# Patient Record
Sex: Male | Born: 1960 | Race: White | Hispanic: No | Smoking: Never smoker
Health system: Southern US, Community
[De-identification: ages and names within clinical notes are randomized; demographics above are authoritative.]

## PROBLEM LIST (undated history)

## (undated) DIAGNOSIS — I10 Essential (primary) hypertension: Secondary | ICD-10-CM

## (undated) DIAGNOSIS — C801 Malignant (primary) neoplasm, unspecified: Secondary | ICD-10-CM

## (undated) HISTORY — PX: HERNIA REPAIR: SHX51

---

## 2016-06-11 ENCOUNTER — Other Ambulatory Visit (HOSPITAL_COMMUNITY): Payer: Self-pay

## 2016-06-11 ENCOUNTER — Other Ambulatory Visit: Payer: Self-pay

## 2016-06-11 ENCOUNTER — Encounter (HOSPITAL_COMMUNITY): Payer: Self-pay

## 2016-06-11 ENCOUNTER — Observation Stay (HOSPITAL_COMMUNITY)
Admission: EM | Admit: 2016-06-11 | Discharge: 2016-06-12 | Disposition: A | Discharge status: Home / Self Care | Payer: BLUE CROSS/BLUE SHIELD | Attending: Internal Medicine | Admitting: Internal Medicine

## 2016-06-11 ENCOUNTER — Emergency Department (HOSPITAL_COMMUNITY): Payer: BLUE CROSS/BLUE SHIELD

## 2016-06-11 DIAGNOSIS — R6884 Jaw pain: Secondary | ICD-10-CM | POA: Insufficient documentation

## 2016-06-11 DIAGNOSIS — M542 Cervicalgia: Secondary | ICD-10-CM | POA: Diagnosis not present

## 2016-06-11 DIAGNOSIS — I160 Hypertensive urgency: Secondary | ICD-10-CM | POA: Diagnosis present

## 2016-06-11 DIAGNOSIS — H9202 Otalgia, left ear: Principal | ICD-10-CM | POA: Insufficient documentation

## 2016-06-11 DIAGNOSIS — I159 Secondary hypertension, unspecified: Secondary | ICD-10-CM

## 2016-06-11 DIAGNOSIS — R918 Other nonspecific abnormal finding of lung field: Secondary | ICD-10-CM | POA: Diagnosis not present

## 2016-06-11 DIAGNOSIS — K029 Dental caries, unspecified: Secondary | ICD-10-CM | POA: Diagnosis present

## 2016-06-11 DIAGNOSIS — R911 Solitary pulmonary nodule: Secondary | ICD-10-CM

## 2016-06-11 DIAGNOSIS — I10 Essential (primary) hypertension: Secondary | ICD-10-CM | POA: Diagnosis not present

## 2016-06-11 LAB — BASIC METABOLIC PANEL
Anion gap: 10 (ref 5–15)
CO2: 23 mmol/L (ref 22–32)
CREATININE: 0.94 mg/dL (ref 0.61–1.24)
Calcium: 8.9 mg/dL (ref 8.9–10.3)
Chloride: 102 mmol/L (ref 101–111)
Glucose, Bld: 107 mg/dL — ABNORMAL HIGH (ref 65–99)
POTASSIUM: 3.1 mmol/L — AB (ref 3.5–5.1)
SODIUM: 135 mmol/L (ref 135–145)

## 2016-06-11 LAB — RAPID URINE DRUG SCREEN, HOSP PERFORMED
Amphetamines: NOT DETECTED
Barbiturates: NOT DETECTED
Benzodiazepines: NOT DETECTED
Cocaine: NOT DETECTED
OPIATES: POSITIVE — AB
Tetrahydrocannabinol: NOT DETECTED

## 2016-06-11 LAB — CBC
HEMATOCRIT: 40.9 % (ref 39.0–52.0)
Hemoglobin: 14.2 g/dL (ref 13.0–17.0)
MCH: 31.7 pg (ref 26.0–34.0)
MCHC: 34.7 g/dL (ref 30.0–36.0)
MCV: 91.3 fL (ref 78.0–100.0)
PLATELETS: 277 10*3/uL (ref 150–400)
RBC: 4.48 MIL/uL (ref 4.22–5.81)
RDW: 12.6 % (ref 11.5–15.5)
WBC: 8.7 10*3/uL (ref 4.0–10.5)

## 2016-06-11 LAB — URINALYSIS, ROUTINE W REFLEX MICROSCOPIC
Bilirubin Urine: NEGATIVE
Glucose, UA: NEGATIVE mg/dL
HGB URINE DIPSTICK: NEGATIVE
Ketones, ur: NEGATIVE mg/dL
LEUKOCYTES UA: NEGATIVE
NITRITE: NEGATIVE
Protein, ur: NEGATIVE mg/dL
SPECIFIC GRAVITY, URINE: 1.007 (ref 1.005–1.030)
pH: 6 (ref 5.0–8.0)

## 2016-06-11 LAB — I-STAT TROPONIN, ED: Troponin i, poc: 0 ng/mL (ref 0.00–0.08)

## 2016-06-11 MED ORDER — ENOXAPARIN SODIUM 40 MG/0.4ML ~~LOC~~ SOLN: 40.0000 mg | SUBCUTANEOUS | Status: DC

## 2016-06-11 MED ORDER — HYDROCHLOROTHIAZIDE 25 MG PO TABS
25.0000 mg | ORAL_TABLET | Freq: Every day | ORAL | Status: DC
  Administered 2016-06-12 (×2): 25 mg via ORAL
  Filled 2016-06-11 (×2): qty 1

## 2016-06-11 MED ORDER — SODIUM CHLORIDE 0.9% FLUSH: 3.0000 mL | Freq: Two times a day (BID) | INTRAVENOUS | Status: DC

## 2016-06-11 MED ORDER — ASPIRIN 81 MG PO CHEW
324.0000 mg | CHEWABLE_TABLET | Freq: Once | ORAL | Status: AC
  Administered 2016-06-11: 324 mg via ORAL
  Filled 2016-06-11: qty 4

## 2016-06-11 MED ORDER — NAPROXEN 250 MG PO TABS
500.0000 mg | ORAL_TABLET | Freq: Three times a day (TID) | ORAL | Status: DC | PRN
  Administered 2016-06-12: 500 mg via ORAL
  Filled 2016-06-11: qty 2

## 2016-06-11 MED ORDER — LABETALOL HCL 5 MG/ML IV SOLN
10.0000 mg | Freq: Once | INTRAVENOUS | Status: AC
  Administered 2016-06-11: 10 mg via INTRAVENOUS
  Filled 2016-06-11: qty 4

## 2016-06-11 NOTE — ED Notes (Signed)
PER EMS: pt from [facility], sent here for intermittent jaw pain that radiates from left side of jaw to the right side of his jaw and ears. UCC called EMS because of the jaw pain associated with diaphoresis. BP- 219/113, HR-94, 2L [place] 96%. CBG-102. RR-18. A&Ox4. Denies CP/SOB.

## 2016-06-11 NOTE — H&P (Signed)
Date: 06/11/2016               Patient Name:  George Blake MRN: [id number]  DOB: 1961/03/22 Age / Sex: 55 y.o., male   PCP: Christain Sacramento, MD         Medical Service: Internal Medicine Teaching Service         Attending Physician: Dr. Aldine Contes, MD    First Contact: Dr. Zada Finders Pager: [phone]  Second Contact: Dr. Charlott Rakes Pager: [phone]       After Hours (After 5p/  First Contact Pager: [phone]  weekends / holidays): Second Contact Pager: [phone]   Chief Complaint: "My ear hurts."  History of Present Illness:  George Blake is a 55 year old man with no known past medical history here with left-sided otalgia.  Last Wednesday, he began having intermittent left-sided ear pain that was worse with lying on his ear. He also is having pain on his left lower molars. He says this pain feels similar to when he cracked a tooth years ago. He went to an urgent care today and his blood pressure was 220/110 so he was sent to the emergency room. He denies any chest pain, diaphoresis, nausea, or exertional-component to his otalgia; no headache, vision changes, or hand-tingling; no jaw claudication, temporal artery tenderness, or vision changes; no fevers or chills; no paresthesias or trigeminal-area shooting pains; no jaw-grinding or tenderness or masseter tenderness; no hearing loss, vertigo, or tinnitus; review of systems was otherwise non-revealing.  Meds: No current facility-administered medications for this encounter.   Current Outpatient Prescriptions  Medication Sig Dispense Refill  . ibuprofen (ADVIL,MOTRIN) 200 MG tablet Take 200 mg by mouth every 4 (four) hours as needed for moderate pain.      Allergies: Allergies as of 06/11/2016  . (Not on File)   History reviewed. No pertinent past medical history. Past Surgical History  Procedure Laterality Date  . Hernia repair     Family history: Father had an MI at 74 years old Sister has hypertension No history of  cancer  Social History   Social History  . Marital Status: Widowed    Spouse Name: N/A  . Number of Children: N/A  . Years of Education: N/A   Occupational History  . Not on file.   Social History Main Topics  . Smoking status: Never Smoker   . Smokeless tobacco: Current User    Types: Chew  . Alcohol Use: Yes  . Drug Use: Not on file  . Sexual Activity: Not on file   Other Topics Concern  . Not on file   Social History Narrative  . No narrative on file   Review of Systems: Per HPI  Physical Exam: Blood pressure 150/101, pulse 81, temperature 98.5 F (36.9 C), temperature source Oral, resp. rate 19, SpO2 93 %. General: resting in bed comfortably, appropriately conversational HEENT: no scleral icterus, extra-ocular muscles intact, very poor dentition with numerous missing teeth, without an overt abscess, small vesicle on left tympanic membrane, small effusion on right tympanic membrane, no external ear tenderness, no temporal tenderness to palpation Cardiac: regular rate and rhythm, no rubs, murmurs or gallops Pulm: breathing well, clear to auscultation bilaterally Abd: bowel sounds normal, soft, nondistended, non-tender Ext: warm and well perfused, without pedal edema Lymph: no cervical or supraclavicular lymphadenopathy Skin: no rash, hair, or nail changes Neuro: alert and oriented X3, cranial nerves II-XII grossly intact, moving all extremities well  Lab results: Basic Metabolic Panel:  Recent Labs  06/11/16 1857  NA 135  K 3.1*  CL 102  CO2 23  GLUCOSE 107*  BUN <5*  CREATININE 0.94  CALCIUM 8.9   CBC:  Recent Labs  06/11/16 1857  WBC 8.7  HGB 14.2  HCT 40.9  MCV 91.3  PLT 277   Urine Drug Screen: Drugs of Abuse     Component Value Date/Time   LABOPIA POSITIVE* 06/11/2016 2051   COCAINSCRNUR NONE DETECTED 06/11/2016 2051   LABBENZ NONE DETECTED 06/11/2016 2051   AMPHETMU NONE DETECTED 06/11/2016 2051   THCU NONE DETECTED 06/11/2016 2051     LABBARB NONE DETECTED 06/11/2016 2051    Urinalysis:  Recent Labs  06/11/16 2051  COLORURINE YELLOW  LABSPEC 1.007  PHURINE 6.0  GLUCOSEU NEGATIVE  HGBUR NEGATIVE  BILIRUBINUR NEGATIVE  KETONESUR NEGATIVE  PROTEINUR NEGATIVE  NITRITE NEGATIVE  LEUKOCYTESUR NEGATIVE   Imaging results:  Dg Chest 2 View  06/11/2016  CLINICAL DATA:  Left side tooth and jaw pain x 4 days, high BP today- no hx HTN, nonsmoker EXAM: CHEST  2 VIEW COMPARISON:  None. FINDINGS: The heart size and vascular pattern are normal. There is moderate diffuse interstitial prominence. Given the absence of cardiac enlargement, pleural effusion, or vascular congestion it is unlikely that this is due to pulmonary edema and is rather likely due to chronic interstitial lung disease. There is a possible 1 cm nodular opacity laterally in the right mid lung zone. IMPRESSION: Moderately severe interstitial change with no comparison study to to determine chronicity. Findings may be due to chronic lung disease. Possible nodular opacity on the right.  CT thorax suggested. Electronically Signed   By: Skipper Cliche M.D.   On: 06/11/2016 19:32    Other results: EKG: normal sinus rhythm, without ischemic changes  Assessment & Plan by Problem:  George Blake is a 55 year old man with no known past medical history here with left-sided otalgia that I think is mostly likely referred pain from poor dentition but may be hypertensive urgency.  Left-sided otalgia: Per above; I think this is likely referred pain from his poor dentition but this may be an anginal equilavent related to hypertensive urgency, although I think the latter is far less likely. He does have a small vesicle on his left tympanic membrane so Ramsay-Hunt is another consideration, but the intermittent pain and absence of skin lesions makes me this this is less likel as well. I doubt otitis media, giant cell arteritis, temporal neuralgia, or TMJ. We will check an  orthopentogram to rule out an abscess and treat his hypertension with oral medications. -Ordered orthopentogram -Started HCTZ 25mg  daily -Started naproxyn for pain -He will follow-up with PCP soon  Hypertension: Per above. -Started HCTZ 25mg  daily  Interstitial changes on chest x-ray with possible nodular opacity: He denies any shortness of breath nor history of smoking, and he is oxygenating well on room air. 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 I have a hard time seeing the nodular opacity; I think a repeat chest x-ray on an outpatient basis would be sufficient. -Recommend outpatient repeat chest x-ray  Dispo: Disposition is deferred at this time, awaiting improvement of current medical problems. Anticipated discharge in approximately 1 day(s).   The patient does have a current PCP Christain Sacramento, MD) and does need an [facility] hospital follow-up appointment after discharge.  The patient does have transportation limitations that hinder transportation to clinic appointments.  Signed: Loleta Chance, MD 06/11/2016, 9:59 PM

## 2016-06-11 NOTE — ED Provider Notes (Signed)
CSN: [id number]     Arrival date & time 06/11/16  1843 History   First MD Initiated Contact with Patient 06/11/16 1855     Chief Complaint  Patient presents with  . Jaw Pain     (Consider location/radiation/quality/duration/timing/severity/associated sxs/prior Treatment) HPI George Blake is a 55 y.o. male with no known medical problems, presents to emergency department complaining of jaw pain, ear pain or facial pain. Patient went to urgent care complaining of this pain. States he has had intermittent pain that changes from one ear to the next, report some spatial pain, some jaw pain, COMES and goes. Reports pain is not associated with exertion, not associated with eating. At time of my interview, patient denies any pain, states no pain with biting down. He does report back teeth. He denies any nasal congestion or sore throat. Denies any fever or chills. He states when he went to urgent care he was found to be diaphoretic. He states that just started at urgent care. He states that he is starting to begin to sweat here as well, but denies any pain at this time still. He was sent here from urgent care due to this jaw pain, diaphoresis, elevated blood pressure. Patient states he has not seen a doctor in over 5 years. He does not have a primary care provider. He does not take any medications other than ibuprofen for headache and toothaches. He is not a smoker. He has no complaints at this time.  History reviewed. No pertinent past medical history. Past Surgical History  Procedure Laterality Date  . Hernia repair     No family history on file. Social History  Substance Use Topics  . Smoking status: Never Smoker   . Smokeless tobacco: Current User    Types: Chew  . Alcohol Use: Yes    Review of Systems  Constitutional: Negative for fever and chills.  HENT: Positive for dental problem and ear pain. Negative for congestion, ear discharge, mouth sores, postnasal drip and sore throat.    Respiratory: Negative for cough, chest tightness and shortness of breath.   Cardiovascular: Negative for chest pain, palpitations and leg swelling.  Gastrointestinal: Negative for nausea, vomiting, abdominal pain, diarrhea and abdominal distention.  Genitourinary: Negative for dysuria, urgency, frequency and hematuria.  Musculoskeletal: Negative for myalgias, arthralgias, neck pain and neck stiffness.  Skin: Negative for rash.  Allergic/Immunologic: Negative for immunocompromised state.  Neurological: Positive for headaches. Negative for dizziness, weakness, light-headedness and numbness.  All other systems reviewed and are negative.     Allergies  Review of patient's allergies indicates not on file.  Home Medications   Prior to Admission medications   Not on File   BP 183/111 mmHg  Pulse 96  Temp(Src) 98.5 F (36.9 C) (Oral)  Resp 16  SpO2 96% Physical Exam  Constitutional: He is oriented to person, place, and time. He appears well-developed and well-nourished. No distress.  HENT:  Head: Normocephalic and atraumatic.  Mouth/Throat: Oropharynx is clear and moist.  Extensive dental decay. No obvious dental abscess, however there is diffuse gingivitis. No trismus. No swelling under the tongue.  Eyes: Conjunctivae are normal.  Neck: Normal range of motion. Neck supple.  Cardiovascular: Normal rate, regular rhythm and normal heart sounds.   Pulmonary/Chest: Effort normal. No respiratory distress. He has no wheezes. He has no rales.  Abdominal: Soft. Bowel sounds are normal. He exhibits no distension. There is no tenderness. There is no rebound.  Musculoskeletal: He exhibits no edema.  Neurological:  He is alert and oriented to person, place, and time.  Skin: Skin is warm and dry. He is not diaphoretic.  Nursing note and vitals reviewed.   ED Course  Procedures (including critical care time) Labs Review Labs Reviewed  BASIC METABOLIC PANEL - Abnormal; Notable for the  following:    Potassium 3.1 (*)    Glucose, Bld 107 (*)    BUN <5 (*)    All other components within normal limits  URINE RAPID DRUG SCREEN, HOSP PERFORMED - Abnormal; Notable for the following:    Opiates POSITIVE (*)    All other components within normal limits  CBC  URINALYSIS, ROUTINE W REFLEX MICROSCOPIC (NOT AT [facility])  Randolm Idol, ED    Imaging Review Dg Chest 2 View  06/11/2016  CLINICAL DATA:  Left side tooth and jaw pain x 4 days, high BP today- no hx HTN, nonsmoker EXAM: CHEST  2 VIEW COMPARISON:  None. FINDINGS: The heart size and vascular pattern are normal. There is moderate diffuse interstitial prominence. Given the absence of cardiac enlargement, pleural effusion, or vascular congestion it is unlikely that this is due to pulmonary edema and is rather likely due to chronic interstitial lung disease. There is a possible 1 cm nodular opacity laterally in the right mid lung zone. IMPRESSION: Moderately severe interstitial change with no comparison study to to determine chronicity. Findings may be due to chronic lung disease. Possible nodular opacity on the right.  CT thorax suggested. Electronically Signed   By: Skipper Cliche M.D.   On: 06/11/2016 19:32   I have personally reviewed and evaluated these images and lab results as part of my medical decision-making.  ED ECG REPORT   Date: 06/12/2016  Rate: 94  Rhythm: normal sinus rhythm  QRS Axis: normal  Intervals: PR prolonged  ST/T Wave abnormalities: normal  Conduction Disutrbances:none  Narrative Interpretation:   Old EKG Reviewed: none available  I have personally reviewed the EKG tracing and agree with the computerized printout as noted.   MDM   Final diagnoses:  Secondary hypertension, unspecified  Jaw pain  Dental decay    Pt in ED with bilateral intermittent neck pain, jaw pain, ear pain. Also independent of pain diaphoresis. Hypertensive, initial BP 183/111, 219/113 at urgent care. Will check labs,  CXR, aspirin ordered.   Pt's labs all unremarkable. Discussed with Dr. Johnney Killian, who has seen pt. His symptoms are worrisome for atypical angina. Will admit for ro. Labetalol ordered for BP.   Spoke with internal medicine. Will admit.   Filed Vitals:   06/11/16 2000 06/11/16 2030 06/11/16 2100 06/11/16 2145  BP: 170/98 174/99 178/100 150/101  Pulse: 91 88 87 81  Temp:      TempSrc:      Resp:  17 13 19   SpO2: 98% 95% 94% 93%      Jeannett Senior, PA-C 06/12/16 0057  Charlesetta Shanks, MD 06/12/16 2325

## 2016-06-12 ENCOUNTER — Observation Stay (HOSPITAL_COMMUNITY): Payer: BLUE CROSS/BLUE SHIELD

## 2016-06-12 DIAGNOSIS — R6884 Jaw pain: Secondary | ICD-10-CM

## 2016-06-12 DIAGNOSIS — I10 Essential (primary) hypertension: Secondary | ICD-10-CM | POA: Diagnosis not present

## 2016-06-12 DIAGNOSIS — K0889 Other specified disorders of teeth and supporting structures: Secondary | ICD-10-CM | POA: Diagnosis not present

## 2016-06-12 DIAGNOSIS — H9202 Otalgia, left ear: Secondary | ICD-10-CM | POA: Diagnosis not present

## 2016-06-12 DIAGNOSIS — I160 Hypertensive urgency: Secondary | ICD-10-CM | POA: Diagnosis not present

## 2016-06-12 DIAGNOSIS — K029 Dental caries, unspecified: Secondary | ICD-10-CM | POA: Diagnosis not present

## 2016-06-12 LAB — TROPONIN I: Troponin I: <0.03 ng/mL (ref <0.031)

## 2016-06-12 MED ORDER — AMOXICILLIN-POT CLAVULANATE 875-125 MG PO TABS: 1.0000 | ORAL_TABLET | Freq: Two times a day (BID) | ORAL | Status: AC

## 2016-06-12 MED ORDER — HYDROCHLOROTHIAZIDE 25 MG PO TABS: 25.0000 mg | ORAL_TABLET | Freq: Every day | ORAL | Status: AC

## 2016-06-12 MED ORDER — ACETAMINOPHEN-CODEINE #3 300-30 MG PO TABS: 1.0000 | ORAL_TABLET | Freq: Four times a day (QID) | ORAL | Status: AC | PRN

## 2016-06-12 NOTE — Discharge Summary (Signed)
Name: George Blake MRN: [id number] DOB: [birth year] 55 y.o. PCP: George Sacramento, MD  Date of Admission: 06/11/2016  6:43 PM Date of Discharge: 06/12/2016 Attending Physician: George Contes, MD  Discharge Diagnosis: 1. Left-sided otalgia 2. HTN Active Problems:   Hypertension   Hypertensive urgency   Jaw pain  Discharge Medications:   Medication List    TAKE these medications        acetaminophen-codeine 300-30 MG tablet  Commonly known as:  TYLENOL #3  Take 1 tablet by mouth every 6 (six) hours as needed for moderate pain.     amoxicillin-clavulanate 875-125 MG tablet  Commonly known as:  AUGMENTIN  Take 1 tablet by mouth 2 (two) times daily.     hydrochlorothiazide 25 MG tablet  Commonly known as:  HYDRODIURIL  Take 1 tablet (25 mg total) by mouth daily.     ibuprofen 200 MG tablet  Commonly known as:  ADVIL,MOTRIN  Take 200 mg by mouth every 4 (four) hours as needed for moderate pain.        Disposition and follow-up:   [name] was discharged from [facility] in Good condition.  At the hospital follow up visit please address:  1.   Otalgia and Dental Infection: Completion of 7 day Augmentin course, improvement of symptoms. Follow up with Dental. Consider ENT referral if no improvement or worsening of symptoms.  HTN: Reassess blood pressure and adjustment of medications. Started on HCTZ 25 mg daily with good response.   2.  Labs / imaging needed at time of follow-up: BMP  3.  Pending labs/ test needing follow-up: none  Follow-up Appointments: Follow-up Information    Schedule an appointment as soon as possible for a visit with George Seller, MD.   Specialty:  Family Medicine   Contact information:   [address] [place]  [address] [phone]       Discharge Instructions: Discharge Instructions    Call MD for:  hives    Complete by:  As directed      Call MD for:  persistant dizziness or  light-headedness    Complete by:  As directed      Call MD for:  persistant nausea and vomiting    Complete by:  As directed      Call MD for:  severe uncontrolled pain    Complete by:  As directed      Call MD for:  temperature >100.4    Complete by:  As directed      Diet - low sodium heart healthy    Complete by:  As directed      Increase activity slowly    Complete by:  As directed            Consultations:    Procedures Performed:  Dg Orthopantogram  06/12/2016  CLINICAL DATA:  LEFT-sided jaw and tooth pain. EXAM: ORTHOPANTOGRAM/PANORAMIC COMPARISON:  None. FINDINGS: Several mandibular teeth are missing. There is severe dental caries observed in the RIGHT greater than LEFT molar and premolar teeth. No definite mandibular osteomyelitis. Periapical lucency is suspected surrounding the roots of the RIGHT mandibular wisdom tooth. Dental consultation is warranted. If further investigation desired, consider CT maxillofacial. IMPRESSION: Multiple dental caries. No definite mandibular osteomyelitis, but periapical lucency is suspected around the RIGHT mandibular wisdom tooth. Electronically Signed   By: George Blake M.D.   On: 06/12/2016 08:48   Dg Chest 2 View  06/12/2016  CLINICAL DATA:  55 year old  male with a history of left jaw pain. Hypertension EXAM: CHEST  2 VIEW COMPARISON:  06/11/2016 FINDINGS: Cardiomediastinal silhouette unchanged in size and contour. No pleural effusion or pneumothorax. Similar appearance of interstitial opacity and left lung base atelectasis/ scarring. No confluent airspace disease. IMPRESSION: Similar appearance to the comparison chest x-ray, with bilateral reticular opacities. This is nonspecific, and differential diagnosis would include both chronic lung changes as well as potentially acute pneumonitis or atypical pneumonia. No evidence of lobar pneumonia or pleural effusion. Signed, George Blake. George Newport, DO [facility]  [facility] Electronically Signed   By: George Blake D.O.   On: 06/12/2016 10:02   Dg Chest 2 View  06/11/2016  CLINICAL DATA:  Left side tooth and jaw pain x 4 days, high BP today- no hx HTN, nonsmoker EXAM: CHEST  2 VIEW COMPARISON:  None. FINDINGS: The heart size and vascular pattern are normal. There is moderate diffuse interstitial prominence. Given the absence of cardiac enlargement, pleural effusion, or vascular congestion it is unlikely that this is due to pulmonary edema and is rather likely due to chronic interstitial lung disease. There is a possible 1 cm nodular opacity laterally in the right mid lung zone. IMPRESSION: Moderately severe interstitial change with no comparison study to to determine chronicity. Findings may be due to chronic lung disease. Possible nodular opacity on the right.  CT thorax suggested. Electronically Signed   By: George Blake M.D.   On: 06/11/2016 19:32    2D Echo: n/a  Cardiac Cath: n/a  Admission HPI: Mr. George Blake is a 55 year old man with no known past medical history here with left-sided otalgia.  Last Wednesday, he began having intermittent left-sided ear pain that was worse with lying on his ear. He also is having pain on his left lower molars. He says this pain feels similar to when he cracked a tooth years ago. He went to an urgent care today and his blood pressure was 220/110 so he was sent to the emergency room. He denies any chest pain, diaphoresis, nausea, or exertional-component to his otalgia; no headache, vision changes, or hand-tingling; no jaw claudication, temporal artery tenderness, or vision changes; no fevers or chills; no paresthesias or trigeminal-area shooting pains; no jaw-grinding or tenderness or masseter tenderness; no hearing loss, vertigo, or tinnitus; review of systems was otherwise non-revealing.  Hospital Course by problem list: Active Problems:   Hypertension   Hypertensive urgency   Jaw pain   Left-sided  otalgia: Patient's pain resolved overnight. Left sided otalgia thought likely referred pain from poor dentition. Orthopantogram showed multiple dental caries and a suspected periapical lucency at the RIGHT mandibular wisdom tooth, no definite osteomyelitis. Started on oral antibiotics for suspected otitis media and mild dental infection.  -Augmentin 875-125 mg q12h for 7 days -Short course Tylenol #3 prn for pain prescribed on discharge -Consider outpatient ENT referral -Social work consulted and provided resources for free and low cost outpatient dental assistance  Hypertension: Patient initially presented to Urgent Care with BP of 220/110 without evidence of end-organ damage. He was started on HCTZ 25 mg with good response in BPs.   Interstitial changes on chest x-ray with possible nodular opacity: He denied any shortness of breath nor history of smoking, and he oxygenated well on room air. 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 Repeat CXR did not comment on possible nodular opacity, but did show similar bilateral reticular opacities to  admission CXR. -Consider outpatient chest CT  Discharge Vitals:   BP 145/90 mmHg  Pulse 62  Temp(Src) 98 F (36.7 C) (Oral)  Resp 18  Ht 5\' 10"  (1.778 m)  Wt 247 lb 5.7 oz (112.2 kg)  BMI 35.49 kg/m2  SpO2 97%  Discharge Labs:  No results found for this or any previous visit (from the past 24 hour(s)).  Signed: Zada Finders, MD 06/13/2016, 11:26 AM    Services Ordered on Discharge: none Equipment Ordered on Discharge: non

## 2016-06-12 NOTE — Progress Notes (Signed)
Order received to discharge.  Telemetry removed and CCMD notified.  IV removed with catheter intact.  Discharge education given to Pt.  All questions answered.  Pt denies pain at discharge.  Pt stable to discharge.

## 2016-06-12 NOTE — Progress Notes (Signed)
   Subjective: Patient feels well this morning. He does not have any left sided ear pain, tooth pain, chest pain, palpitations, SOB, or any complaints this morning. He is eager to go home.  Objective: Vital signs in last 24 hours: Filed Vitals:   06/11/16 2145 06/11/16 2230 06/11/16 2331 06/12/16 0508  BP: 150/101 177/105 185/95 145/90  Pulse: 81 93 87 62  Temp:    98 F (36.7 C)  TempSrc:    Oral  Resp: 19 17 18 18   Height:   5\' 10"  (1.778 m)   Weight:   247 lb 5.7 oz (112.2 kg)   SpO2: 93% 96% 94% 97%   Weight change:   Intake/Output Summary (Last 24 hours) at 06/12/16 1057 Last data filed at 06/12/16 0800  Gross per 24 hour  Intake    240 ml  Output   2300 ml  Net  -2060 ml   General: resting in bed comfortably, no acute distress HEENT: Poor oral dentition without obvious abscess, Right TM bulging with effusion and/or ?retraction. Left ear canal with cerumen. Light reflex seen bilaterally, no otorrhea or external ear tenderness Cardiac: RRR, no rubs, murmurs or gallops Pulm: clear to auscultation bilaterally, moving normal volumes of air Abd: soft, nontender, nondistended, BS present Ext: warm and well perfused, no pedal edema   Assessment/Plan: Active Problems:   Hypertension   Hypertensive urgency   Jaw pain   Left-sided otalgia: Patient says this pain has now resolved. Likely referred pain from poor dentition, however it is possible he may have to coincident processes going on. Interestingly, his right ear shows changes suggested of effusion or retraction of the TM. Orthopantogram shows multiple dental caries and a suspected periapical lucency at the RIGHT mandibular wisdom tooth, no definite osteomyelitis. Patient is advised to follow up with his PCP for general health maintenance in addition to acute issues. He may benefit from an outpatient referral to ENT and Dental. Will consult social work to see if we can find any outpatient dental assistance for him that may  offer a more financially friendly option as he has no dental insurance. Will start oral antibiotics for suspected otitis media and mild dental infection. -Start Augmentin 875-125 mg q12h for 7 days -Short course Tylenol #3 prn for pain -He will follow-up with PCP soon -Consider outpatient ENT referral] -Social work consult for outpatient dental assistance  Hypertension: Patient initially presented to Urgent Care with BP of 220/110 without evidence of end-organ damage. He was started on HCTZ 25 mg overnight with good response in BPs. -Continue HCTZ 25mg  daily -f/u with PCP for further management  Interstitial changes on chest x-ray with possible nodular opacity: He denies any shortness of breath nor history of smoking, and he is oxygenating well on room air. 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 Repeat CXR does not comment on possible nodular opacity, but does show similar bilateral reticular opacities -[place] outpatient chest CT  Dispo: Anticipated discharge today.  The patient does have a current PCP Christain [place], MD) and does not need an [facility] hospital follow-up appointment after discharge.  The patient does not have transportation limitations that hinder transportation to clinic appointments.      Zada Finders, MD 06/12/2016, 10:57 AM

## 2016-06-12 NOTE — Clinical Social Work Note (Signed)
Free and low cost dental resources provided to patient.  Liz Beach MSW, [place], [place], [id number]

## 2016-06-12 NOTE — Discharge Instructions (Signed)
Your ear pain is likely due to referred pain from your teeth. You may also have an inner ear infection. We will prescribe a 1-week course of oral antibiotics (Augmentin) to cover for both possibilities.   Please follow up with your Primary Care Physician for continued management of this and general health issues. You may need an outpatient referral to an ENT. We will try to provide you with information for a dentist as well, as you should see one soon after you leave the hospital.  Your blood pressure was high when you came to the hospital. You were started on a medicine called HCTZ for this. Please continue this and follow up with your PCP for further management.

## 2018-06-01 ENCOUNTER — Other Ambulatory Visit: Payer: Self-pay | Admitting: Family Medicine

## 2018-06-01 ENCOUNTER — Ambulatory Visit
Admission: RE | Admit: 2018-06-01 | Discharge: 2018-06-01 | Disposition: A | Discharge status: Home / Self Care | Payer: BLUE CROSS/BLUE SHIELD | Source: Ambulatory Visit | Attending: Family Medicine | Admitting: Family Medicine

## 2018-06-01 DIAGNOSIS — R634 Abnormal weight loss: Secondary | ICD-10-CM

## 2018-06-15 ENCOUNTER — Other Ambulatory Visit: Payer: Self-pay | Admitting: Family Medicine

## 2018-06-15 DIAGNOSIS — R634 Abnormal weight loss: Secondary | ICD-10-CM

## 2018-06-15 DIAGNOSIS — R1900 Intra-abdominal and pelvic swelling, mass and lump, unspecified site: Secondary | ICD-10-CM

## 2018-06-18 ENCOUNTER — Ambulatory Visit
Admission: RE | Admit: 2018-06-18 | Discharge: 2018-06-18 | Disposition: A | Discharge status: Home / Self Care | Payer: BLUE CROSS/BLUE SHIELD | Source: Ambulatory Visit | Attending: Family Medicine | Admitting: Family Medicine

## 2018-06-18 DIAGNOSIS — R634 Abnormal weight loss: Secondary | ICD-10-CM

## 2018-06-18 DIAGNOSIS — R1900 Intra-abdominal and pelvic swelling, mass and lump, unspecified site: Secondary | ICD-10-CM

## 2018-06-18 MED ORDER — IOPAMIDOL (ISOVUE-300) INJECTION 61%
125.0000 mL | Freq: Once | INTRAVENOUS | Status: AC | PRN
  Administered 2018-06-18: 125 mL via INTRAVENOUS

## 2018-06-22 NOTE — Progress Notes (Signed)
Unable to reach pt by phone and unable to leave VM. I called listed patient contact, Frann Rider, and requested that she ask pt to call Isaiah Blakes to discuss scheduling an appointment. I also provided my name and contact information. She stated that patient has voice mail set up so that you can't leave a message. I stressed importance of being able to leave messages for timely information.

## 2018-06-26 NOTE — Progress Notes (Signed)
Have made multiple attempt to reach pt by phone. I have left a message with pt's sister and son to have patient call my direct line.. I have not been able to speak directly with pt to schedule initial consult with Dr. Benay Spice. Patient's phone rings but no ability to leave VM.

## 2018-06-26 NOTE — Progress Notes (Signed)
Attempted to call pt to schedule initial consult. Phone rings but unable to leave VM.

## 2018-06-27 NOTE — Progress Notes (Signed)
Patient has decided to seek treatment at [facility]. Referral closed.

## 2018-07-31 ENCOUNTER — Encounter (HOSPITAL_COMMUNITY): Payer: Self-pay | Admitting: Family Medicine

## 2018-07-31 ENCOUNTER — Emergency Department (HOSPITAL_COMMUNITY)
Admission: EM | Admit: 2018-07-31 | Discharge: 2018-08-01 | Disposition: A | Discharge status: Home / Self Care | Payer: BLUE CROSS/BLUE SHIELD | Attending: Emergency Medicine | Admitting: Emergency Medicine

## 2018-07-31 DIAGNOSIS — Z9221 Personal history of antineoplastic chemotherapy: Secondary | ICD-10-CM | POA: Diagnosis not present

## 2018-07-31 DIAGNOSIS — F17220 Nicotine dependence, chewing tobacco, uncomplicated: Secondary | ICD-10-CM | POA: Insufficient documentation

## 2018-07-31 DIAGNOSIS — L24A9 Irritant contact dermatitis due friction or contact with other specified body fluids: Secondary | ICD-10-CM

## 2018-07-31 DIAGNOSIS — I10 Essential (primary) hypertension: Secondary | ICD-10-CM | POA: Diagnosis not present

## 2018-07-31 DIAGNOSIS — Z85038 Personal history of other malignant neoplasm of large intestine: Secondary | ICD-10-CM | POA: Insufficient documentation

## 2018-07-31 DIAGNOSIS — Y69 Unspecified misadventure during surgical and medical care: Secondary | ICD-10-CM | POA: Diagnosis not present

## 2018-07-31 DIAGNOSIS — F1210 Cannabis abuse, uncomplicated: Secondary | ICD-10-CM | POA: Diagnosis not present

## 2018-07-31 DIAGNOSIS — T8130XA Disruption of wound, unspecified, initial encounter: Secondary | ICD-10-CM | POA: Insufficient documentation

## 2018-07-31 DIAGNOSIS — T148XXA Other injury of unspecified body region, initial encounter: Secondary | ICD-10-CM

## 2018-07-31 HISTORY — DX: Malignant (primary) neoplasm, unspecified: C80.1

## 2018-07-31 HISTORY — DX: Essential (primary) hypertension: I10

## 2018-07-31 LAB — BASIC METABOLIC PANEL
ANION GAP: 15 (ref 5–15)
BUN: 17 mg/dL (ref 6–20)
CO2: 25 mmol/L (ref 22–32)
Calcium: 7.8 mg/dL — ABNORMAL LOW (ref 8.9–10.3)
Chloride: 95 mmol/L — ABNORMAL LOW (ref 98–111)
Creatinine, Ser: 0.69 mg/dL (ref 0.61–1.24)
GFR calc Af Amer: >60 mL/min (ref >60)
GFR calc non Af Amer: >60 mL/min (ref >60)
GLUCOSE: 121 mg/dL — AB (ref 70–99)
POTASSIUM: 4.5 mmol/L (ref 3.5–5.1)
Sodium: 135 mmol/L (ref 135–145)

## 2018-07-31 LAB — CBC WITH DIFFERENTIAL/PLATELET
BASOS ABS: 0 10*3/uL (ref 0.0–0.1)
Basophils Relative: 0 %
Eosinophils Absolute: 0.1 10*3/uL (ref 0.0–0.7)
Eosinophils Relative: 0 %
HEMATOCRIT: 35.2 % — AB (ref 39.0–52.0)
Hemoglobin: 11.3 g/dL — ABNORMAL LOW (ref 13.0–17.0)
LYMPHS ABS: 0.8 10*3/uL (ref 0.7–4.0)
LYMPHS PCT: 7 %
MCH: 27.2 pg (ref 26.0–34.0)
MCHC: 32.1 g/dL (ref 30.0–36.0)
MCV: 84.8 fL (ref 78.0–100.0)
Monocytes Absolute: 0.6 10*3/uL (ref 0.1–1.0)
Monocytes Relative: 6 %
NEUTROS ABS: 9.7 10*3/uL — AB (ref 1.7–7.7)
Neutrophils Relative %: 87 %
Platelets: 296 10*3/uL (ref 150–400)
RBC: 4.15 MIL/uL — ABNORMAL LOW (ref 4.22–5.81)
RDW: 20.7 % — ABNORMAL HIGH (ref 11.5–15.5)
WBC: 11.1 10*3/uL — ABNORMAL HIGH (ref 4.0–10.5)

## 2018-07-31 LAB — HEPATIC FUNCTION PANEL
ALK PHOS: 134 U/L — AB (ref 38–126)
ALT: 19 U/L (ref 0–44)
AST: 54 U/L — ABNORMAL HIGH (ref 15–41)
Albumin: 2.1 g/dL — ABNORMAL LOW (ref 3.5–5.0)
BILIRUBIN INDIRECT: 1.2 mg/dL — AB (ref 0.3–0.9)
Bilirubin, Direct: 0.4 mg/dL — ABNORMAL HIGH (ref 0.0–0.2)
TOTAL PROTEIN: 5.4 g/dL — AB (ref 6.5–8.1)
Total Bilirubin: 1.6 mg/dL — ABNORMAL HIGH (ref 0.3–1.2)

## 2018-07-31 LAB — I-STAT CG4 LACTIC ACID, ED: Lactic Acid, Venous: 6.56 mmol/L (ref 0.5–1.9)

## 2018-07-31 MED ORDER — SODIUM CHLORIDE 0.9 % IV BOLUS (SEPSIS): 500.0000 mL | Freq: Once | INTRAVENOUS | Status: DC

## 2018-07-31 MED ORDER — SODIUM CHLORIDE 0.9 % IV BOLUS (SEPSIS)
1000.0000 mL | Freq: Once | INTRAVENOUS | Status: AC
  Administered 2018-07-31: 1000 mL via INTRAVENOUS

## 2018-07-31 MED ORDER — SODIUM CHLORIDE 0.9 % IV BOLUS (SEPSIS): 1000.0000 mL | Freq: Once | INTRAVENOUS | Status: DC

## 2018-07-31 NOTE — ED Notes (Signed)
Pt has 1 set of cultures in lab if needed along with full rainbow.

## 2018-07-31 NOTE — ED Provider Notes (Signed)
[facility] DEPT Provider Note   CSN: [id number] Arrival date & time: 07/31/18  2046    History   Chief Complaint Chief Complaint  Patient presents with  . Wound Infection    HPI George Blake is a 57 y.o. male.  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 Patient and family report noticing some drainage from the area. Drainage has been purulent and mild, but area is not painful. He denies abdominal pain, fevers, N/V, chest pain, SOB.  He feels weak, but often feels weak when off his course of steroids. The patient was recently told to discontinue these medications. He last drained 2L of ascites off his abdomen today and denies any changes to the color of this ascites from baseline. He called his on call provider at [facility], where he is receiving his care; they advised he be evaluated to r/o "deeper pus pocket".       Past Medical History:  Diagnosis Date  . Cancer [facility])    Stage 4 Colon Cancer with chemotherapy  . Hypertension     Patient Active Problem List   Diagnosis Date Noted  . Hypertension 06/11/2016  . Hypertensive urgency 06/11/2016  . Jaw pain     Past Surgical History:  Procedure Laterality Date  . HERNIA REPAIR          Home Medications    Prior to Admission medications   Medication Sig Start Date End Date Taking? Authorizing Provider  dexamethasone (DECADRON) 4 MG tablet Take 8 mg by mouth as directed. Take 2 tablets (8 mg) for 2 Days after chemo. 07/29/18  Yes [provider]  DPH-Lido-AlHydr-MgHydr-Simeth (FIRST-MOUTHWASH BLM) SUSP Use as directed 5 mLs in the mouth or throat every 4 (four) hours as needed (mouth pain).   Yes [provider]  ondansetron (ZOFRAN) 8 MG tablet 8 mg every 8 (eight) hours as needed for nausea or vomiting.  07/13/18  Yes  [provider]  acetaminophen-codeine (TYLENOL #3) 300-30 MG tablet Take 1 tablet by mouth every 6 (six) hours as needed for moderate pain. 06/12/16   Zada Finders, MD  hydrochlorothiazide (HYDRODIURIL) 25 MG tablet Take 1 tablet (25 mg total) by mouth daily. Patient not taking: Reported on 07/31/2018 06/12/16   Zada Finders, MD  ibuprofen (ADVIL,MOTRIN) 200 MG tablet Take 200 mg by mouth every 4 (four) hours as needed for moderate pain.    [provider]  LORazepam (ATIVAN) 1 MG tablet Take 1 mg by mouth at bedtime as needed for sleep.  07/27/18   [provider]  prochlorperazine (COMPAZINE) 10 MG tablet Take 10 mg by mouth every 6 (six) hours as needed for nausea or vomiting.  07/30/18   [provider]    Family History History reviewed. No pertinent family history.  Social History Social History   Tobacco Use  . Smoking status: Never Smoker  . Smokeless tobacco: Current User    Types: Chew  Substance Use Topics  . Alcohol use: Not Currently  . Drug use: Yes    Types: Marijuana     Allergies   Patient has no known allergies.   Review of Systems Review of Systems   Physical Exam Updated Vital Signs BP 111/80   Pulse (!) 123   Temp 98.8 F (37.1 C) (Oral)   Resp Marland Kitchen)  26   Ht 5\' 11"  (1.803 m)   Wt 82.6 kg (182 lb)   SpO2 97%   BMI 25.38 kg/m   Physical Exam  Constitutional: He is oriented to person, place, and time. He appears well-developed and well-nourished. No distress.  Chronically ill appearing, nontoxic and in NAD  HENT:  Head: Normocephalic and atraumatic.  Eyes: Conjunctivae and EOM are normal. No scleral icterus.  Neck: Normal range of motion.  Cardiovascular: Regular rhythm and intact distal pulses.  Tachycardia ~120's  Pulmonary/Chest: Effort normal. No stridor. No respiratory distress. He has no wheezes. He has no rales.  Lungs CTAB  Abdominal: Soft. He exhibits distension and ascites. There is no tenderness (no  TTP in all quadrants).    Musculoskeletal: Normal range of motion.  Neurological: He is alert and oriented to person, place, and time. He exhibits normal muscle tone. Coordination normal.  Skin: Skin is warm and dry. No rash noted. He is not diaphoretic. No erythema.  Psychiatric: He has a normal mood and affect. His behavior is normal.  Nursing note and vitals reviewed.    ED Treatments / Results  Labs (all labs ordered are listed, but only abnormal results are displayed) Labs Reviewed  CBC WITH DIFFERENTIAL/PLATELET - Abnormal; Notable for the following components:      Result Value   WBC 11.1 (*)    RBC 4.15 (*)    Hemoglobin 11.3 (*)    HCT 35.2 (*)    RDW 20.7 (*)    Neutro Abs 9.7 (*)    All other components within normal limits  I-STAT CG4 LACTIC ACID, ED - Abnormal; Notable for the following components:   Lactic Acid, Venous 6.56 (*)    All other components within normal limits  CULTURE, BLOOD (ROUTINE X 2)  CULTURE, BLOOD (ROUTINE X 2)  BASIC METABOLIC PANEL  URINALYSIS, ROUTINE W REFLEX MICROSCOPIC  HEPATIC FUNCTION PANEL    EKG None  Radiology No results found.  Procedures Aspiration of blood/fluid Date/Time: 08/01/2018 12:30 AM Performed by: Antonietta Breach, PA-C Authorized by: Antonietta Breach, PA-C  Consent: The procedure was performed in an emergent situation. Verbal consent obtained. Risks and benefits: risks, benefits and alternatives were discussed Consent given by: patient Patient understanding: patient states understanding of the procedure being performed Patient consent: the patient's understanding of the procedure matches consent given Procedure consent: procedure consent matches procedure scheduled Relevant documents: relevant documents present and verified Test results: test results available and properly labeled Site marked: the operative site was marked Imaging studies: imaging studies available Required items: required blood products, implants,  devices, and special equipment available Patient identity confirmed: verbally with patient and arm band Time out: Immediately prior to procedure a "time out" was called to verify the correct patient, procedure, equipment, support staff and site/side marked as required. Local anesthesia used: no  Anesthesia: Local anesthesia used: no  Sedation: Patient sedated: no  Patient tolerance: Patient tolerated the procedure well with no immediate complications Comments: Aspiration of 55cc of peritoneal fluid from existing PleurX catheter for diagnostic studies. Fluid clear, yellow. Patient tolerated procedure well.    (including critical care time)  Medications Ordered in ED Medications  sodium chloride 0.9 % bolus 1,000 mL (0 mLs Intravenous Stopped 08/01/18 0031)    And  sodium chloride 0.9 % bolus 1,000 mL (has no administration in time range)    And  sodium chloride 0.9 % bolus 500 mL (has no administration in time range)  doxycycline (VIBRA-TABS) tablet 100 mg (  100 mg Oral Given 08/01/18 0128)     Initial Impression / Assessment and Plan / ED Course  I have reviewed the triage vital signs and the nursing notes.  Pertinent labs & imaging results that were available during my care of the patient were reviewed by me and considered in my medical decision making (see chart for details).     19:32 PM 57 year old male with history of metastatic cancer with existing Pleurx catheter presents to the ED over concern for infection associated with catheter site.  Denies any abdominal pain, nausea, vomiting, fevers.  He is afebrile in the emergency department today.  Abdomen distended secondary to ascites.  Nontender in all quadrants.  No peritoneal signs.  Patient noted to be tachycardic, but states this is his baseline.  On chart review from [facility], baseline HR appears to range from 110-119bpm. Had leukocytosis of 23.2 on 07/27/18 prior to infusion. Only lactate level for comparison is from 06/29/18 when  it was 2.5, shortly after being diagnosed with metastatic disease.  Discussed with patient that most clear way to r/o abscess pocket would be with CT scan of the abdomen and pelvis. I also advised that we drain some ascites off his PleurX to evaluate for SBP. Discussed that prematurely starting antibiotics would "muddy the waters" in a sense, should a deeper infection be present. Patient verbalizes understanding, but is resistant to these modalities as he believes they are presently too invasive and time consuming; states that they are more than he desires to have done presently.  11:48 PM Patient now open to having some fluid drained from his peritoneum.  Have discussed plan for outpatient antibiotics once fluid obtained.  Patient agreeable to plan and reliable for follow-up with his oncologist.  12:30 AM Peritoneal fluid aspirated from Pleurx catheter.  Fluid was yellow in color, clear.  Sent for testing and culture.  There is mild erythema around the catheter site associated with scant purulent drainage.  This is favored to represent a more superficial soft tissue skin infection rather than bacterial peritonitis.  Given that blood and fluid cultures are pending, will start the patient on doxycycline for soft tissue infection coverage.  He has been instructed to follow-up with his oncologist for wound recheck.  Return precautions discussed and provided. Patient discharged in stable condition with no unaddressed concerns.   Final Clinical Impressions(s) / ED Diagnoses   Final diagnoses:  Wound drainage    ED Discharge Orders    None       Antonietta Breach, PA-C 08/01/18 0201    Orpah Greek, MD 08/01/18 [id number]

## 2018-07-31 NOTE — ED Triage Notes (Signed)
Patient is a cancer patient who is receiving chemotherapy at [facility]. He has a drain in his side and patient thinks it is possible infected. Denies any fever.

## 2018-08-01 LAB — ALBUMIN, PLEURAL OR PERITONEAL FLUID: ALBUMIN FL: 1.1 g/dL

## 2018-08-01 LAB — PROTEIN, PLEURAL OR PERITONEAL FLUID: Total protein, fluid: <3 g/dL

## 2018-08-01 LAB — LACTATE DEHYDROGENASE, PLEURAL OR PERITONEAL FLUID: LD, Fluid: 238 U/L — ABNORMAL HIGH (ref 3–23)

## 2018-08-01 LAB — GLUCOSE, PLEURAL OR PERITONEAL FLUID: Glucose, Fluid: 108 mg/dL

## 2018-08-01 MED ORDER — DOXYCYCLINE HYCLATE 100 MG PO TABS
100.0000 mg | ORAL_TABLET | Freq: Once | ORAL | Status: AC
  Administered 2018-08-01: 100 mg via ORAL
  Filled 2018-08-01: qty 1

## 2018-08-01 MED ORDER — DOXYCYCLINE HYCLATE 100 MG PO CAPS: 100.0000 mg | ORAL_CAPSULE | Freq: Two times a day (BID) | ORAL | 0 refills | Status: AC

## 2018-08-01 NOTE — Discharge Instructions (Addendum)
We believe that the drainage from your Pleurx catheter site is superficial.  You have peritoneal fluid cultures and blood cultures pending.  We recommend that you take doxycycline for management of infection.  Antibiotics will not start to work until 24 to 48 hours after initiation of therapy.  We recommend that you be promptly evaluated if you develop fever over 100.4 F.  You will be notified if any of your cultures are positive.  Follow-up with your oncologist as soon as you are able for wound recheck.

## 2018-08-04 LAB — BODY FLUID CULTURE: Culture: NO GROWTH

## 2018-08-05 LAB — CULTURE, BLOOD (ROUTINE X 2): Culture: NO GROWTH

## 2018-12-05 IMAGING — CT CT ABD-PELV W/ CM
1 of 3 series · 12 of 32 positions shown, 18 images · IV contrast (APPLIED)
Comparison: Chest radiograph of 06/01/2018.

CLINICAL DATA: Abdominal mass. Unexplained weight loss. Decreased
appetite and night sweats for 6 months. Nausea. Left inguinal hernia
repair. No history of primary malignancy.

EXAM:
CT ABDOMEN AND PELVIS WITH CONTRAST
TECHNIQUE: Multidetector CT imaging of the abdomen and pelvis was performed
using the standard protocol following bolus administration of
intravenous contrast.
CONTRAST:  125mL T3FRNA-QJJ IOPAMIDOL (T3FRNA-QJJ) INJECTION 61%

[Series 2: abd/pelvis w/cm · axial · 0.92mm/px · z∈[+812,+1236]mm · 12 of 101 slices shown, 18 images]
[im 8/101  soft-tissue]
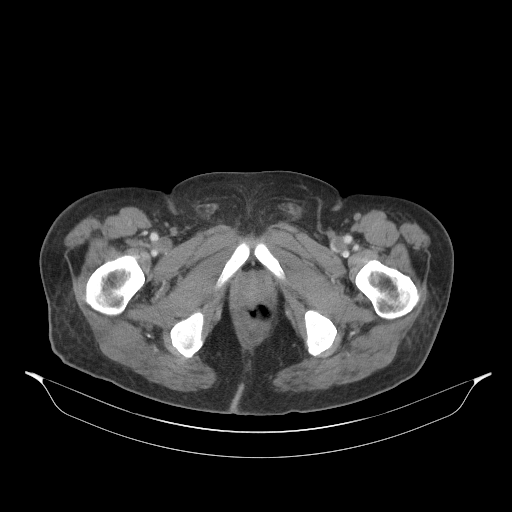
[im 8/101  bone]
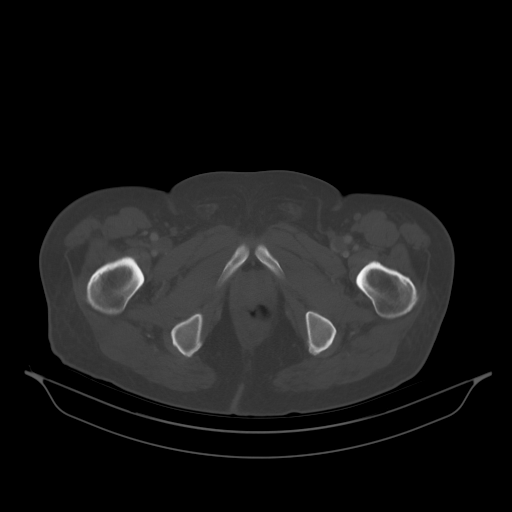
[im 15/101  soft-tissue]
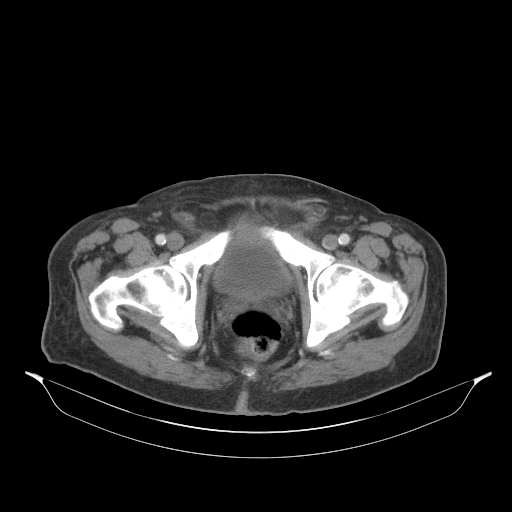
[im 22/101  soft-tissue]
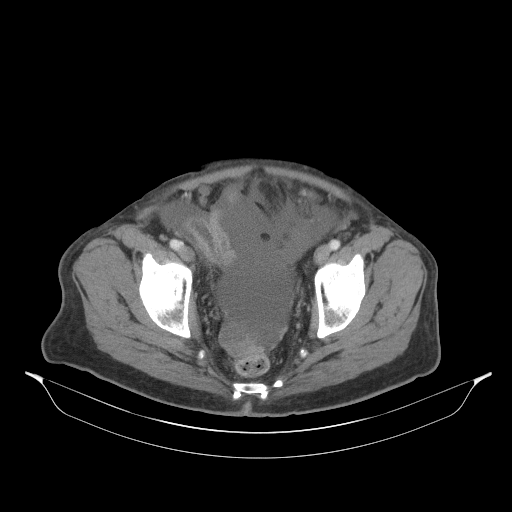
[im 29/101  soft-tissue]
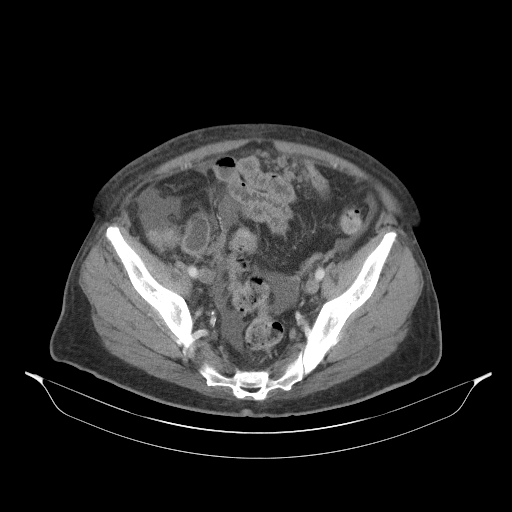
[im 36/101  soft-tissue]
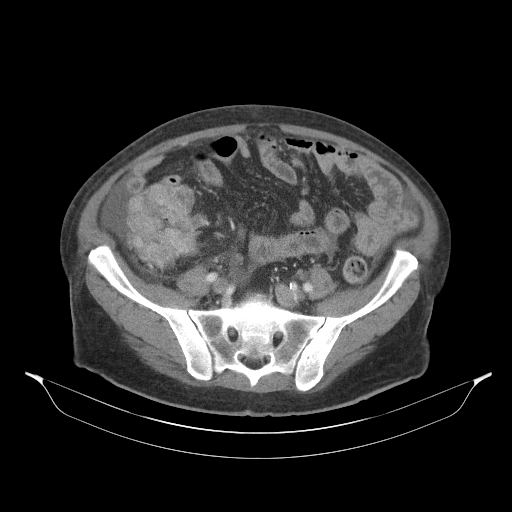
[im 43/101  soft-tissue]
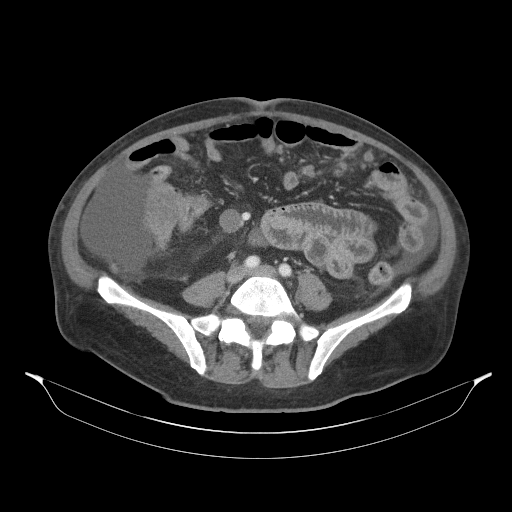
[im 58/101  soft-tissue]
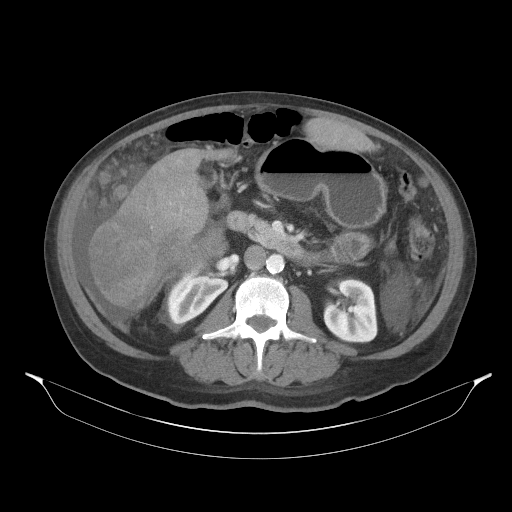
[im 65/101  soft-tissue]
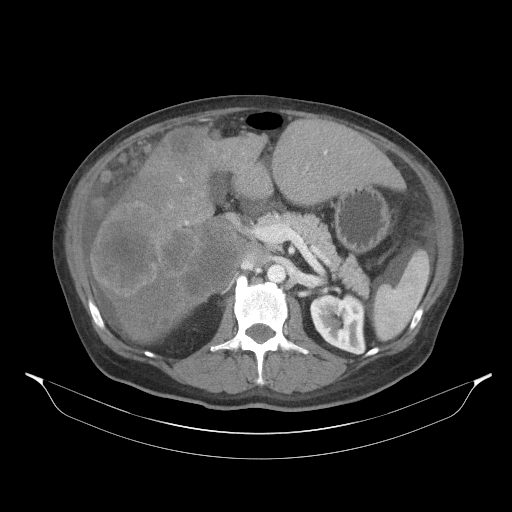
[im 72/101  soft-tissue]
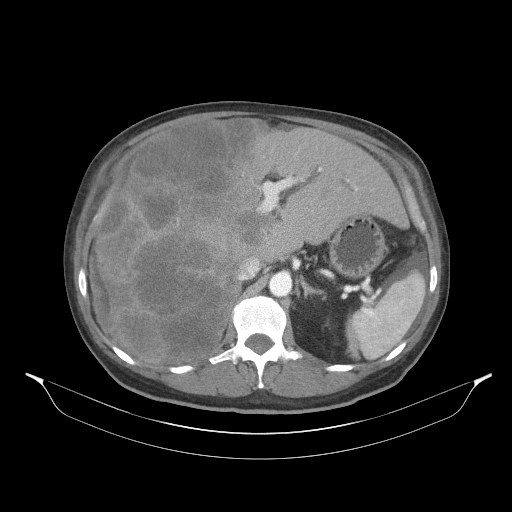
[im 72/101  lung]
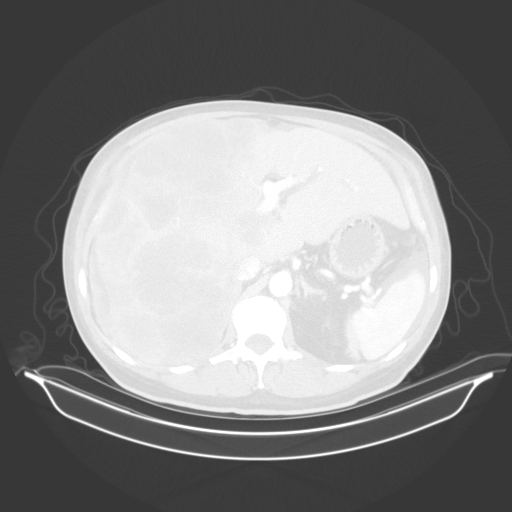
[im 72/101  bone]
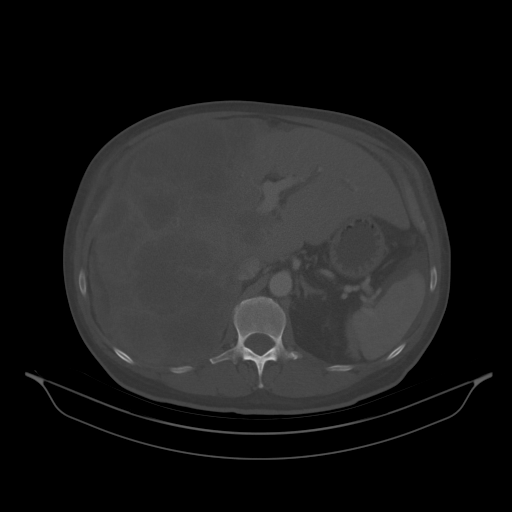
[im 79/101  soft-tissue]
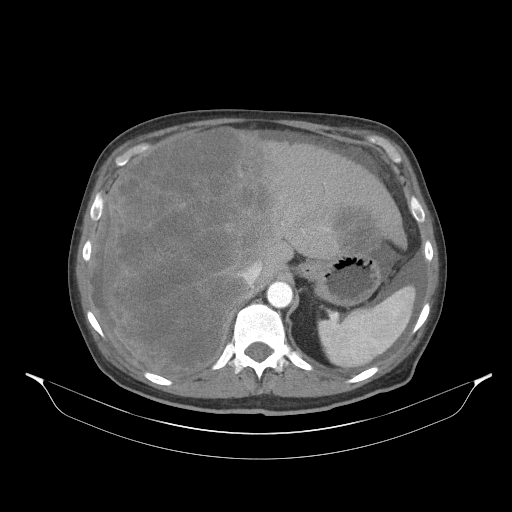
[im 79/101  lung]
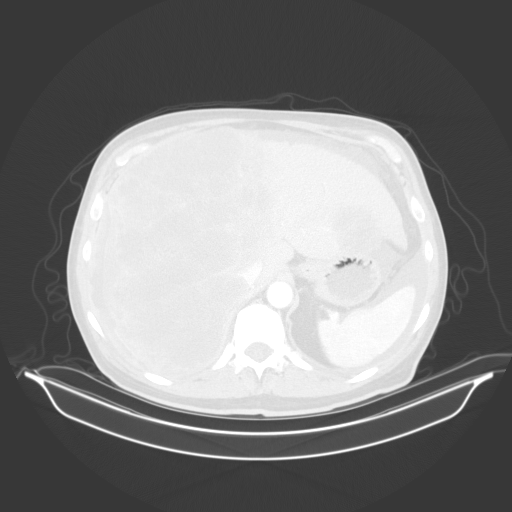
[im 86/101  soft-tissue]
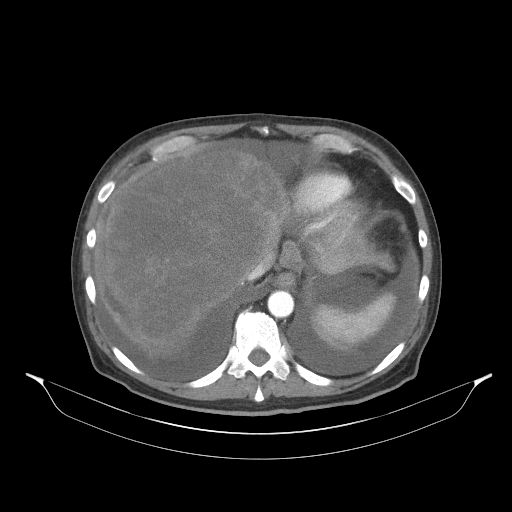
[im 86/101  lung]
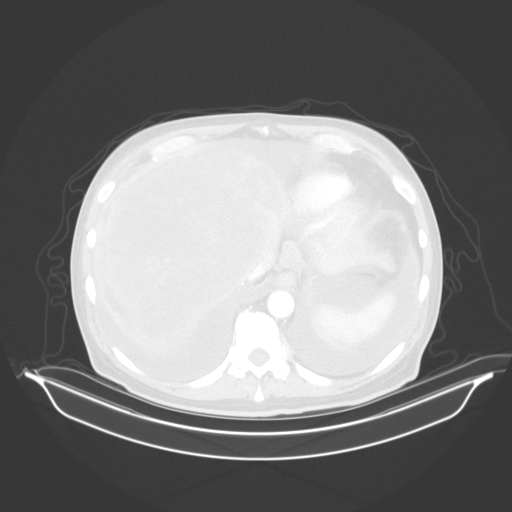
[im 93/101  soft-tissue]
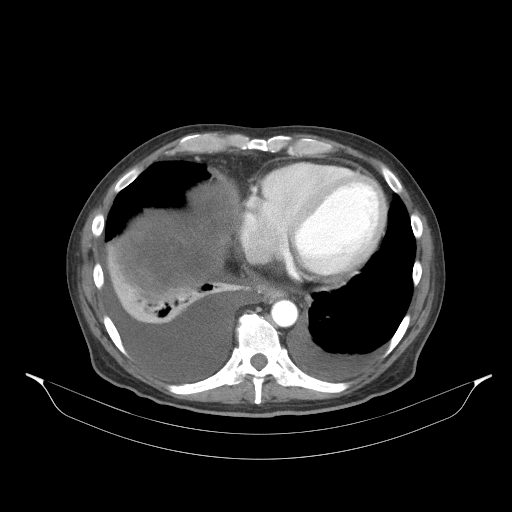
[im 93/101  lung]
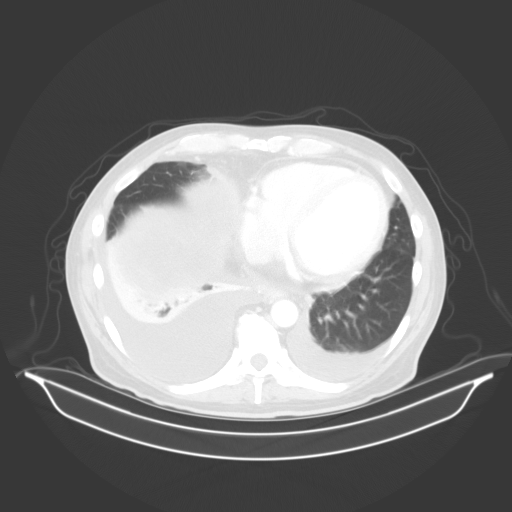

[12 of 32 positions shown; findings below may reference images not displayed]

FINDINGS: Lower chest: Minimal motion degradation. Right greater than left
base atelectasis. Small right larger than left pleural effusions.
Normal heart size with minimal, likely physiologic pericardial
fluid.

Hepatobiliary: Hepatomegaly at 25.1 cm. Lateral segment left liver
lobe hypoattenuating mass of 4.7 x 4.2 cm on image [DATE].

The right lobe and medial segment left lobe of the liver are nearly
entirely replaced by masses. In summary, these masses measure on the
order of 19.7 x 16.9 cm on image [DATE]. 23.6 cm craniocaudal.

Left intrahepatic duct dilatation is mild to moderate and likely
secondary to mass effect in the porta hepatis by central tumor.
Normal common duct caliber. Normal gallbladder.

Pancreas: Normal, without mass or ductal dilatation.

Spleen: Normal in size, without focal abnormality.

Adrenals/Urinary Tract: Normal left adrenal gland. Favor mass-effect
upon the right adrenal by a liver lesion rather than a right adrenal
nodule on image 34/2. Normal kidneys, without hydronephrosis. Normal
urinary bladder.

Stomach/Bowel: Normal stomach, without wall thickening.
Cecal/ascending colonic primary, as evidenced by soft tissue
thickening including on image 66/2 and coronal image 50. No
complicating bowel obstruction.

Vascular/Lymphatic: Aortic and branch vessel atherosclerosis.
Ileocolic mesenteric adenopathy, including an index node of 1.9 cm
on image 59/2.

Reproductive: Normal prostate.

Other: Small to moderate volume abdominopelvic ascites. Widespread
omental/peritoneal metastasis. Example right pericolic gutter
implant of 2.5 cm on image 49/2. Left abdominal omental implant of
12 mm on image 47/2.

Musculoskeletal: Posterior right tenth rib lucency and expansion,
including on image [DATE]. Mild disc bulges at L4-5 and L5-S1.
IMPRESSION: 1. Ascending colonic/cecal primary.
2. Widespread metastatic disease, including large volume liver
metastasis, omental/peritoneal metastasis, and abdominal nodal
metastasis. No complicating bowel obstruction.
3. Small bilateral pleural effusions with mild bibasilar
atelectasis.
4. Posterior right tenth rib lucent expansile lesion, suspicious for
osseous metastasis.
5.  Aortic Atherosclerosis (IL1DW-[id number].U).
6. Mild to moderate left intrahepatic duct dilatation secondary to
mass effect by central tumor. No common duct dilatation identified.
Recommend attention on follow-up.

These results will be called to the ordering clinician or
representative by the Radiologist Assistant, and communication
documented in the PACS or zVision Dashboard.
